# Patient Record
Sex: Male | Born: 1980 | Race: Black or African American | Hispanic: No
Health system: Southern US, Community
[De-identification: ages and names within clinical notes are randomized; demographics above are authoritative.]

---

## 2017-06-19 ENCOUNTER — Inpatient Hospital Stay
Admit: 2017-06-19 | Discharge: 2017-06-19 | Disposition: A | Discharge status: Home / Self Care | Payer: Medicaid - Out of State | Attending: Emergency Medicine

## 2017-06-19 DIAGNOSIS — K0889 Other specified disorders of teeth and supporting structures: Secondary | ICD-10-CM

## 2017-06-19 MED ORDER — TRAMADOL 50 MG TAB
50 mg | ORAL | Status: AC
Start: 2017-06-19 — End: 2017-06-19
  Administered 2017-06-19: 06:00:00 via ORAL

## 2017-06-19 MED ORDER — TRAMADOL 50 MG TAB
50 mg | ORAL_TABLET | Freq: Four times a day (QID) | ORAL | 0 refills | Status: DC | PRN
Start: 2017-06-19 — End: 2017-08-20

## 2017-06-19 MED FILL — TRAMADOL 50 MG TAB: 50 mg | ORAL | Qty: 2

## 2017-06-19 NOTE — ED Provider Notes (Addendum)
HPI Comments: 1:53 AM  Chief complaint: dental pain   History obtained from patient  Nicolas Walsh is a 36 y.o. male who presents to the Emergency Dept with c/o dental pain. He reports pain to his left lower jaw for the past few days. He has not yet seen a dentist. He tried taking tylenol and motrin for the pain which did not provide much relief. He denies any fever, chills or facial swelling.        History reviewed. No pertinent past medical history.    History reviewed. No pertinent surgical history.      History reviewed. No pertinent family history.    Social History     Social History   ??? Marital status: SINGLE     Spouse name: N/A   ??? Number of children: N/A   ??? Years of education: N/A     Occupational History   ??? Not on file.     Social History Main Topics   ??? Smoking status: Former Smoker   ??? Smokeless tobacco: Never Used   ??? Alcohol use No   ??? Drug use: No   ??? Sexual activity: Yes     Partners: Female     Other Topics Concern   ??? Not on file     Social History Narrative   ??? No narrative on file         ALLERGIES: Review of patient's allergies indicates no known allergies.    Review of Systems   Constitutional: Negative for chills and fever.   HENT: Positive for dental problem. Negative for sore throat.    Respiratory: Negative for cough and shortness of breath.    Cardiovascular: Negative for chest pain.   Gastrointestinal: Negative for abdominal pain, diarrhea, nausea and vomiting.   Genitourinary: Negative for flank pain.   Musculoskeletal: Negative for back pain, neck pain and neck stiffness.   Skin: Negative for rash.   Allergic/Immunologic: Negative for immunocompromised state.   Neurological: Negative for dizziness and headaches.       Vitals:    06/19/17 0148 06/19/17 0254   BP: (!) 164/94 121/76   Pulse: 70 72   Resp: 20 19   Temp: 97.7 ??F (36.5 ??C) 97.1 ??F (36.2 ??C)   SpO2: 100% 100%   Weight: 81.6 kg (180 lb)    Height:  (1.778 m)             Physical Exam    Constitutional: He is oriented to person, place, and time. Vital signs are normal. He appears well-developed and well-nourished. No distress.   HENT:   Head: Normocephalic and atraumatic.   No facial edema or erythema.  + tenderness to palpation to left lower jaw.  Mild tenderness to palpation over adjacent gingiva.  No gingival fluctuance or induration.    Eyes: Conjunctivae and EOM are normal. No scleral icterus.   Neck: Normal range of motion. Neck supple. No JVD present.   Pulmonary/Chest: Effort normal. No respiratory distress.   Neurological: He is alert and oriented to person, place, and time.   Skin: Skin is warm and dry. He is not diaphoretic.   Psychiatric: He has a normal mood and affect.   Nursing note and vitals reviewed.       MDM      ED Course       Procedures      <EMERGENCY DEPARTMENT CASE SUMMARY>    Impression/Differential Diagnosis: dentalgia    Plan: ultram  ED Course: Pt is a well appearing 37 y.o. male who presents to the ED with c/o dental pain. Rx ultram was provided. There is no facial swelling or fever to suggest a dental abscess at this time.     D/w patient dx tx plan, and need for follow-up. Pt advised to RTER for new or worsening symptoms. The patient verbalized understanding. All questions answered, and the patient agrees with the plan.    Final Impression/Diagnosis:   1. Dentalgia        Patient condition at time of disposition:  stable     I have reviewed the following home medications:    Prior to Admission medications    Medication Sig Start Date End Date Taking? Authorizing Provider   amLODIPine (NORVASC) 10 mg tablet Take 10 mg by mouth daily.   Yes Phys Other, MD   traMADol (ULTRAM) 50 mg tablet Take 1 Tab by mouth every six (6) hours as needed for Pain. Max Daily Amount: 200 mg. 06/19/17  Yes Peggye Form, NP       Peggye Form, NP     I was personally available for consultation in the emergency department.   I have reviewed the chart and agree with the documentation recorded by the [facility], including the assessment, treatment plan, and disposition.  Earnie Larsson, MD

## 2017-06-19 NOTE — ED Notes (Signed)
Pt discharged with instructions, pt verbalized understanding of instruction.  No visible distress noted, ambulates with steady gait without assistance.

## 2017-06-19 NOTE — ED Triage Notes (Signed)
Patient complains of toothache for a while. Had an appointment with dentist and did not went because of an accident. Took Ibuprofen before coming.

## 2017-08-20 DIAGNOSIS — K0889 Other specified disorders of teeth and supporting structures: Secondary | ICD-10-CM

## 2017-08-20 NOTE — ED Provider Notes (Addendum)
10:33 PM  Chief complaint: dental pain  History obtained from patient  Nicolas LarsenJeremy Walsh is a 36 y.o. male who presents to the Emergency Dept with c/o dental pain. He reports that a filling fell out of a tooth on the left lower jaw. [facility] for tomorrow morning. He is concerned that he needs an antibiotic.              History reviewed. No pertinent past medical history.    History reviewed. No pertinent surgical history.      History reviewed. No pertinent family history.    Social History     Socioeconomic History   ??? Marital status: SINGLE     Spouse name: Not on file   ??? Number of children: Not on file   ??? Years of education: Not on file   ??? Highest education level: Not on file   Social Needs   ??? Financial resource strain: Not on file   ??? Food insecurity - worry: Not on file   ??? Food insecurity - inability: Not on file   ??? Transportation needs - medical: Not on file   ??? Transportation needs - non-medical: Not on file   Occupational History   ??? Not on file   Tobacco Use   ??? Smoking status: Former Smoker   ??? Smokeless tobacco: Never Used   Substance and Sexual Activity   ??? Alcohol use: No   ??? Drug use: No   ??? Sexual activity: Yes     Partners: Female   Other Topics Concern   ??? Not on file   Social History Narrative   ??? Not on file         ALLERGIES: Patient has no known allergies.    Review of Systems   Constitutional: Negative for chills, diaphoresis and fever.   HENT: Positive for dental problem. Negative for rhinorrhea.    Eyes: Negative for photophobia and visual disturbance.   Respiratory: Negative for cough and shortness of breath.    Cardiovascular: Negative for chest pain.   Gastrointestinal: Negative for abdominal pain, diarrhea, nausea and vomiting.   Genitourinary: Negative for dysuria and hematuria.   Musculoskeletal: Negative for myalgias.   Skin: Negative for color change and pallor.   Neurological: Negative for seizures and syncope.       Vitals:    08/20/17 2144   BP: 131/80    Pulse: 92   Resp: 18   Temp: 98.6 ??F (37 ??C)   SpO2: 100%   Weight: 86.2 kg (190 lb)   Height: 5\' 10"  (1.778 m)            Physical Exam   Constitutional: He is oriented to person, place, and time. Vital signs are normal. He appears well-developed and well-nourished. No distress.   HENT:   Head: Normocephalic and atraumatic.   No facial edema or erythema.  + tenderness to palpation to lower gumline. No gingival fluctuance or induration.    Eyes: Conjunctivae and EOM are normal. No scleral icterus.   Neck: Normal range of motion. Neck supple. No JVD present.   Pulmonary/Chest: Effort normal. No respiratory distress.   Neurological: He is alert and oriented to person, place, and time.   Skin: Skin is warm and dry. He is not diaphoretic.   Psychiatric: He has a normal mood and affect.   Nursing note and vitals reviewed.       MDM       Procedures    <  EMERGENCY DEPARTMENT CASE SUMMARY>  Impression/Differential Diagnosis: dentalgia     Plan: motrin, clindamycin    ED Course: Pt is a well appearing 36 y.o. male who presents to the ED with c/o dental pain. Rx clindamycin was provided.    D/w patient dx, tx plan, and need for follow-up. Pt advised to RTER for new or worsening symptoms. The patient verbalized understanding. All questions answered, and the patient agrees with the plan.    Final Impression/Diagnosis:   1. Dentalgia        Patient condition at time of disposition:  stable   I have reviewed the following home medications:    Prior to Admission medications    Medication Sig Start Date End Date Taking? Authorizing Provider   clindamycin (CLEOCIN) 300 mg capsule Take 1 Cap by mouth three (3) times daily for 7 days. 08/20/17 08/27/17 Yes Mounitz, Orlean Pattenina E, NP   amLODIPine (NORVASC) 10 mg tablet Take 10 mg by mouth daily.    Other, Phys, MD       Peggye Formina E Mounitz, NP     I was personally available for consultation in the emergency department. I have reviewed the chart and agree with the documentation recorded by the  midlevel provider, including the assessment, treatment plan, and disposition.    Scot JunStephen Lynlee Stratton, MD

## 2017-08-20 NOTE — ED Notes (Signed)
Pt Discharged home. Discharge instructions, follow up instructions, and prescriptions discussed with patient. Pt ambulatory from ED. Gait steady. Pt DC instructions highlighted in accordance with policy for safety.

## 2017-08-20 NOTE — ED Triage Notes (Signed)
Patient is due to see a dentist for tooth extraction but could not take the pain any longer

## 2017-08-21 ENCOUNTER — Inpatient Hospital Stay
Admit: 2017-08-21 | Discharge: 2017-08-21 | Disposition: A | Discharge status: Home / Self Care | Payer: Medicaid - Out of State | Attending: Emergency Medicine

## 2017-08-21 MED ORDER — IBUPROFEN 600 MG TAB
600 mg | ORAL_TABLET | Freq: Four times a day (QID) | ORAL | 0 refills | Status: AC | PRN
Start: 2017-08-21 — End: ?

## 2017-08-21 MED ORDER — IBUPROFEN 600 MG TAB
600 mg | ORAL | Status: AC
Start: 2017-08-21 — End: 2017-08-20
  Administered 2017-08-21: 03:00:00 via ORAL

## 2017-08-21 MED ORDER — CLINDAMYCIN 150 MG CAP
150 mg | ORAL | Status: AC
Start: 2017-08-21 — End: 2017-08-20
  Administered 2017-08-21: 03:00:00 via ORAL

## 2017-08-21 MED ORDER — CLINDAMYCIN 300 MG CAP
300 mg | ORAL_CAPSULE | Freq: Three times a day (TID) | ORAL | 0 refills | Status: AC
Start: 2017-08-21 — End: 2017-08-27

## 2017-08-21 MED ORDER — AMOXICILLIN 875 MG TAB
875 mg | ORAL_TABLET | Freq: Two times a day (BID) | ORAL | 0 refills | Status: AC
Start: 2017-08-21 — End: 2017-08-31

## 2017-08-21 MED ORDER — CLINDAMYCIN 300 MG CAP
300 mg | ORAL_CAPSULE | Freq: Three times a day (TID) | ORAL | 0 refills | Status: DC
Start: 2017-08-21 — End: 2017-08-20

## 2017-08-21 MED ORDER — IBUPROFEN 600 MG TAB
600 mg | ORAL | Status: DC
Start: 2017-08-21 — End: 2017-08-20

## 2017-08-21 MED FILL — IBUPROFEN 600 MG TAB: 600 mg | ORAL | Qty: 1

## 2017-08-21 MED FILL — CLINDAMYCIN 150 MG CAP: 150 mg | ORAL | Qty: 2

## 2021-06-01 ENCOUNTER — Other Ambulatory Visit: Payer: Self-pay

## 2021-06-01 ENCOUNTER — Encounter (HOSPITAL_COMMUNITY): Payer: Self-pay | Admitting: Emergency Medicine

## 2021-06-01 ENCOUNTER — Emergency Department (HOSPITAL_COMMUNITY): Payer: Medicaid - Out of State

## 2021-06-01 ENCOUNTER — Emergency Department (HOSPITAL_COMMUNITY)
Admission: EM | Admit: 2021-06-01 | Discharge: 2021-06-02 | Disposition: A | Discharge status: Home / Self Care | Payer: Medicaid - Out of State | Attending: Emergency Medicine | Admitting: Emergency Medicine

## 2021-06-01 DIAGNOSIS — R591 Generalized enlarged lymph nodes: Secondary | ICD-10-CM | POA: Insufficient documentation

## 2021-06-01 DIAGNOSIS — R61 Generalized hyperhidrosis: Secondary | ICD-10-CM | POA: Diagnosis not present

## 2021-06-01 DIAGNOSIS — R634 Abnormal weight loss: Secondary | ICD-10-CM | POA: Insufficient documentation

## 2021-06-01 DIAGNOSIS — Z20822 Contact with and (suspected) exposure to covid-19: Secondary | ICD-10-CM | POA: Insufficient documentation

## 2021-06-01 DIAGNOSIS — R079 Chest pain, unspecified: Secondary | ICD-10-CM | POA: Insufficient documentation

## 2021-06-01 DIAGNOSIS — R509 Fever, unspecified: Secondary | ICD-10-CM | POA: Diagnosis not present

## 2021-06-01 DIAGNOSIS — R059 Cough, unspecified: Secondary | ICD-10-CM | POA: Diagnosis not present

## 2021-06-01 DIAGNOSIS — R0602 Shortness of breath: Secondary | ICD-10-CM | POA: Diagnosis not present

## 2021-06-01 LAB — CBC
HCT: 38 % — ABNORMAL LOW (ref 39.0–52.0)
Hemoglobin: 12.9 g/dL — ABNORMAL LOW (ref 13.0–17.0)
MCH: 30.7 pg (ref 26.0–34.0)
MCHC: 33.9 g/dL (ref 30.0–36.0)
MCV: 90.5 fL (ref 80.0–100.0)
Platelets: 279 10*3/uL (ref 150–400)
RBC: 4.2 MIL/uL — ABNORMAL LOW (ref 4.22–5.81)
RDW: 11.9 % (ref 11.5–15.5)
WBC: 9.1 10*3/uL (ref 4.0–10.5)
nRBC: 0 % (ref 0.0–0.2)

## 2021-06-01 LAB — BASIC METABOLIC PANEL
Anion gap: 6 (ref 5–15)
BUN: 14 mg/dL (ref 6–20)
CO2: 26 mmol/L (ref 22–32)
Calcium: 9.3 mg/dL (ref 8.9–10.3)
Chloride: 104 mmol/L (ref 98–111)
Creatinine, Ser: 0.88 mg/dL (ref 0.61–1.24)
GFR, Estimated: >60 mL/min (ref >60)
Glucose, Bld: 133 mg/dL — ABNORMAL HIGH (ref 70–99)
Potassium: 3.8 mmol/L (ref 3.5–5.1)
Sodium: 136 mmol/L (ref 135–145)

## 2021-06-01 LAB — TROPONIN I (HIGH SENSITIVITY)
Troponin I (High Sensitivity): 4 ng/L (ref <18)
Troponin I (High Sensitivity): 3 ng/L (ref <18)

## 2021-06-01 NOTE — ED Triage Notes (Signed)
Patient with chest pain and shortness of breath, had a long drive from [place] for funeral here in [place].  He states that he had been feeling poorly before the drive.  Patient states that he has pain when he breaths deep in his chest.  He does have some lymph node swelling in his neck.

## 2021-06-02 ENCOUNTER — Emergency Department (HOSPITAL_COMMUNITY): Payer: Medicaid - Out of State

## 2021-06-02 LAB — RESP PANEL BY RT-PCR (FLU A&B, COVID) ARPGX2
Influenza A by PCR: NEGATIVE
Influenza B by PCR: NEGATIVE
SARS Coronavirus 2 by RT PCR: NEGATIVE

## 2021-06-02 MED ORDER — IOHEXOL 350 MG/ML SOLN
50.0000 mL | Freq: Once | INTRAVENOUS | Status: AC | PRN
  Administered 2021-06-02: 50 mL via INTRAVENOUS

## 2021-06-02 NOTE — ED Provider Notes (Signed)
[facility] EMERGENCY DEPARTMENT Provider Note   CSN: [id number] Arrival date & time: 06/01/21  1919     History Chief Complaint  Patient presents with   Chest Pain   Shortness of Breath    Adrian Cunningham is a 40 y.o. male presents to the ER with worsening CP for the last 2 days.  Pt reports he has been feeling run down and generally unwell for the last several weeks, but has consistently tested negative for COVID-19.  Patient reports about 8 weeks ago he noticed some swelling above his left clavicle.  States that he saw his primary care doctor and they tested for HIV and other things but did not find a diagnosis.  Additionally he reports taking antibiotics for this but it did not improve.  He reports over the last month he has developed drenching night sweats, fevers, body aches, cough and weight loss.  Believes he is lost 7 pounds in the last several weeks.  States that the pain in his chest feels like a significant pressure and laying flat makes it worse.  Patient reports he had COVID 5 months ago but felt he was over those symptoms.  Denies a history of cancer.  No alleviating factors.  The history is provided by the patient and medical records. No language interpreter was used.      History reviewed. No pertinent past medical history.  There are no problems to display for this patient.   History reviewed. No pertinent surgical history.     No family history on file.     Home Medications Prior to Admission medications   Not on File    Allergies    Codeine and Penicillins  Review of Systems   Review of Systems  Constitutional:  Positive for fatigue. Negative for appetite change, diaphoresis, fever and unexpected weight change.  HENT:  Positive for congestion. Negative for mouth sores.   Eyes:  Negative for visual disturbance.  Respiratory:  Positive for chest tightness and shortness of breath. Negative for cough and wheezing.   Cardiovascular:   Positive for chest pain.  Gastrointestinal:  Negative for abdominal pain, constipation, diarrhea, nausea and vomiting.  Endocrine: Negative for polydipsia, polyphagia and polyuria.  Genitourinary:  Negative for dysuria, frequency, hematuria and urgency.  Musculoskeletal:  Negative for back pain and neck stiffness.  Skin:  Negative for rash.  Allergic/Immunologic: Negative for immunocompromised state.  Neurological:  Negative for syncope, light-headedness and headaches.  Hematological:  Does not bruise/bleed easily.  Psychiatric/Behavioral:  Negative for sleep disturbance. The patient is not nervous/anxious.    Physical Exam Updated Vital Signs BP (!) 145/75   Pulse 99   Temp 98.6 F (37 C)   Resp 17   SpO2 98%   Physical Exam Vitals and nursing note reviewed.  Constitutional:      General: He is not in acute distress.    Appearance: He is not diaphoretic.  HENT:     Head: Normocephalic.  Eyes:     General: No scleral icterus.    Conjunctiva/sclera: Conjunctivae normal.  Cardiovascular:     Rate and Rhythm: Normal rate and regular rhythm.     Pulses: Normal pulses.          Radial pulses are 2+ on the right side and 2+ on the left side.     Heart sounds: Normal heart sounds.  Pulmonary:     Effort: Pulmonary effort is normal. No tachypnea, accessory muscle usage, prolonged expiration, respiratory distress  or retractions.     Breath sounds: Normal breath sounds. No stridor. No wheezing or rhonchi.     Comments: Equal chest rise. No increased work of breathing. Chest:    Abdominal:     General: There is no distension.     Palpations: Abdomen is soft.     Tenderness: There is no abdominal tenderness. There is no guarding or rebound.  Musculoskeletal:     Cervical back: Normal range of motion.     Comments: Moves all extremities equally and without difficulty.  Lymphadenopathy:     Upper Body:     Right upper body: No supraclavicular adenopathy.     Left upper body:  Supraclavicular adenopathy present.  Skin:    General: Skin is warm and dry.     Capillary Refill: Capillary refill takes less than 2 seconds.  Neurological:     Mental Status: He is alert.     GCS: GCS eye subscore is 4. GCS verbal subscore is 5. GCS motor subscore is 6.     Comments: Speech is clear and goal oriented.  Psychiatric:        Mood and Affect: Mood normal.    ED Results / Procedures / Treatments   Labs (all labs ordered are listed, but only abnormal results are displayed) Labs Reviewed  BASIC METABOLIC PANEL - Abnormal; Notable for the following components:      Result Value   Glucose, Bld 133 (*)    All other components within normal limits  CBC - Abnormal; Notable for the following components:   RBC 4.20 (*)    Hemoglobin 12.9 (*)    HCT 38.0 (*)    All other components within normal limits  RESP PANEL BY RT-PCR (FLU A&B, COVID) [id number]  TROPONIN I (HIGH SENSITIVITY)  TROPONIN I (HIGH SENSITIVITY)    EKG EKG Interpretation  Date/Time:  Saturday June 01 2021 20:00:32 EDT Ventricular Rate:  89 PR Interval:  144 QRS Duration: 84 QT Interval:  336 QTC Calculation: 408 R Axis:   64 Text Interpretation: Normal sinus rhythm Minimal voltage criteria for LVH, may be normal variant ( Sokolow-Lyon ) Septal infarct , age undetermined Abnormal ECG no prior available for comparison Confirmed by Tilden Fossa [phone]) on 06/02/2021 12:09:29 AM  Radiology DG Chest 2 View  Result Date: 06/01/2021 CLINICAL DATA:  Chest pain. Shortness of breath. had a long drive from [place] for funeral here in [place]. He states that he had been feeling poorly before the drive EXAM: CHEST - 2 VIEW COMPARISON:  None. FINDINGS: The heart and mediastinal contours are within normal limits. No focal consolidation. No pulmonary edema. No pleural effusion. No pneumothorax. No acute osseous abnormality. IMPRESSION: No active cardiopulmonary disease. Electronically Signed   By: Tish Frederickson M.D.   On:  06/01/2021 20:54   CT Angio Chest PE W and/or Wo Contrast  Result Date: 06/02/2021 CLINICAL DATA:  PE suspected, high prob Chest pain and shortness of breath.  Recent long car ride. EXAM: CT ANGIOGRAPHY CHEST WITH CONTRAST TECHNIQUE: Multidetector CT imaging of the chest was performed using the standard protocol during bolus administration of intravenous contrast. Multiplanar CT image reconstructions and MIPs were obtained to evaluate the vascular anatomy. CONTRAST:  10mL OMNIPAQUE IOHEXOL 350 MG/ML SOLN COMPARISON:  Radiograph yesterday. FINDINGS: Cardiovascular: There are no filling defects within the pulmonary arteries to suggest pulmonary embolus. There is some streak artifact from dense IV contrast in the SVC obscuring right upper lobe evaluation. Thoracic aorta is normal  in caliber. Aberrant right subclavian artery courses posterior to the esophagus. No aortic dissection. The heart is normal in size. No pericardial effusion. Mediastinum/Nodes: Soft tissue densities throughout the mediastinum and both hilar regions suspicious for adenopathy. Discrete lymph nodes are difficult to measure at multiple levels, however there is a 2 cm subcarinal node, series 5, image 70. Right hilar nodes measure 12-13 mm, series 5, image 67, 69, and 73 there is a 10 mm prevascular node. 15 mm high mediastinal node series 5, image 40. No axillary adenopathy. The esophagus difficult to delineate, however no definite esophageal wall thickening. No thyroid nodule. Lungs/Pleura: Apical emphysema or blebs. No pneumonia or focal airspace disease. No pulmonary nodule or mass. No pleural fluid. No findings of pulmonary edema. Upper Abdomen: No acute or unexpected findings. No evidence of upper abdominal adenopathy. Musculoskeletal: There are no acute or suspicious osseous abnormalities. No chest wall soft tissue abnormality. Review of the MIP images confirms the above findings. IMPRESSION: 1. No pulmonary embolus. 2. Mediastinal and  bilateral hilar adenopathy, with largest lymph node measuring 2 cm in the subcarinal station. Recommend clinical and laboratory evaluation for lymphoproliferative disorder. Follow-up CT or PET/CT in 3-6 months. 3. Apical emphysema or blebs. No acute pulmonary process or pulmonary nodule. 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 Electronically Signed   By: Narda Rutherford M.D.   On: 06/02/2021 03:00    Procedures Procedures   Medications Ordered in ED Medications  iohexol (OMNIPAQUE) 350 MG/ML injection 50 mL (50 mLs Intravenous Contrast Given 06/02/21 0246)    ED Course  I have reviewed the triage vital signs and the nursing notes.  Pertinent labs & imaging results that were available during my care of the patient were reviewed by me and considered in my medical decision making (see chart for details).    MDM Rules/Calculators/A&P                            Patient presents with concerning symptoms for lymphoproliferative disorder.  Supraclavicular lymph node is nontender, nonmobile and matted.  He now has chest pain, cough, night sweats, fevers and weight loss.  He has not had any known imaging.  Tachycardic on arrival.  Some concern for pulmonary embolism as well since he has traveled from [place] in the last several days for a family funeral.  4:06 AM CT scan with mediastinal lymphadenopathy and bilateral hilar adenopathy.  Concern for lymphoproliferative disorder - suspect lymphoma.  No evidence of pulmonary embolism.  Long discussion with patient about concern for this and need for close follow-up.  He has been given a copy of his labs to take to his primary care.  Questions answered.  Discussed reasons to return immediately to the emergency department.  Patient states understanding and is in agreement with the plan.    Final Clinical Impression(s) / ED Diagnoses Final diagnoses:  Chest pain, unspecified type  Lymphadenopathy    Rx / DC Orders ED Discharge  Orders     None        Dick Hark, Boyd Kerbs 06/02/21 0408    Tilden Fossa, MD 06/02/21 [phone]

## 2021-06-02 NOTE — Discharge Instructions (Addendum)
Your CT scan is concerning for lymphoma - cancer of the lymph nodes.  You will need additional testing for a final diagnosis.  Please utilize the findings from today's CT scan to discuss with your primary care physician additional steps for diagnosis and treatment.  1. Medications: usual home medications 2. Treatment: rest, drink plenty of fluids,  3. Follow Up: Please followup with your primary doctor in 2-3 days for discussion of your diagnoses and further evaluation after today's visit; if you do not have a primary care doctor use the resource guide provided to find one; Please return to the ER for new or worsening symptoms

## 2021-06-02 NOTE — ED Notes (Signed)
Pt discharged and ambulated out of the ED without difficulty. 

## 2021-12-05 ENCOUNTER — Encounter (HOSPITAL_COMMUNITY): Payer: Self-pay | Admitting: Radiology

## 2022-10-22 IMAGING — CT CT ANGIO CHEST
2 of 6 series · 18 of 36 positions shown · IV contrast (omnipaque)
Comparison: Radiograph yesterday.

CLINICAL DATA: PE suspected, high prob

Chest pain and shortness of breath.  Recent long car ride.
EXAM:
CT ANGIOGRAPHY CHEST WITH CONTRAST
TECHNIQUE: Multidetector CT imaging of the chest was performed using the
standard protocol during bolus administration of intravenous
contrast. Multiplanar CT image reconstructions and MIPs were
obtained to evaluate the vascular anatomy.
CONTRAST:  50mL OMNIPAQUE IOHEXOL 350 MG/ML SOLN

[Series 7: pe thins · axial · 0.74mm/px · z∈[-283,-16]mm · 17 of 426 slices shown]
[im 22/426  lung]
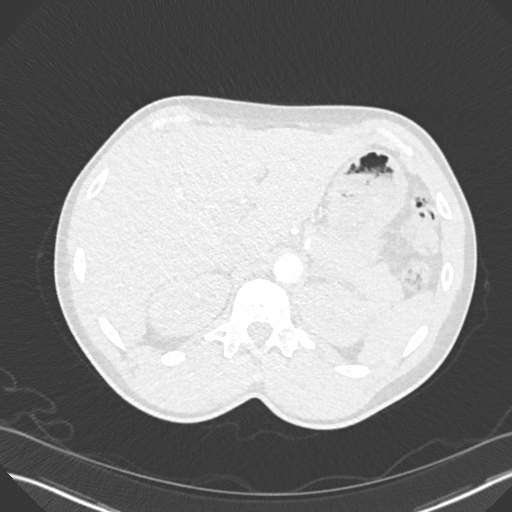
[im 43/426  mediastinal]
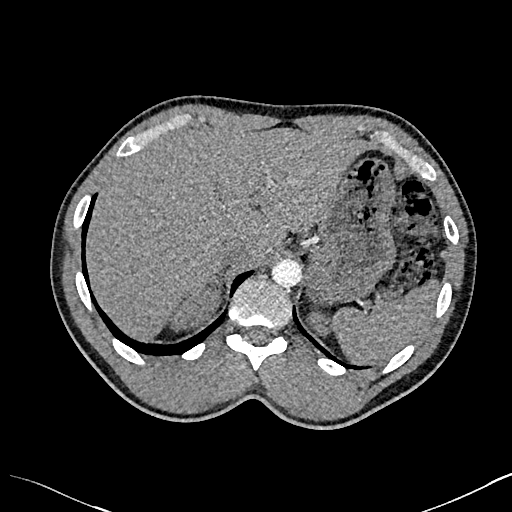
[im 64/426  lung]
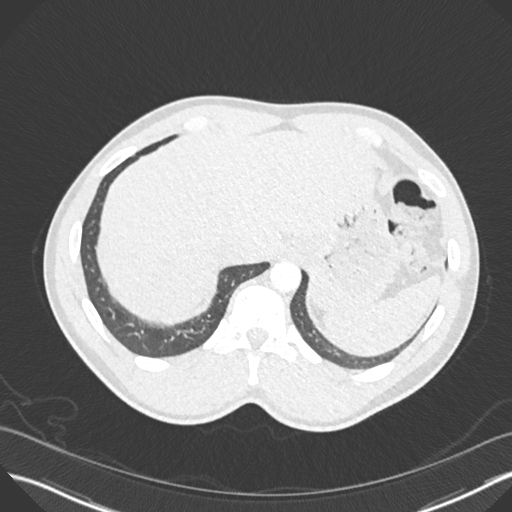
[im 86/426  mediastinal]
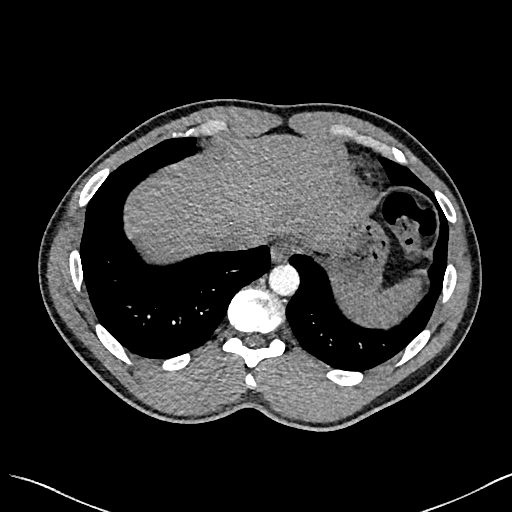
[im 128/426  lung]
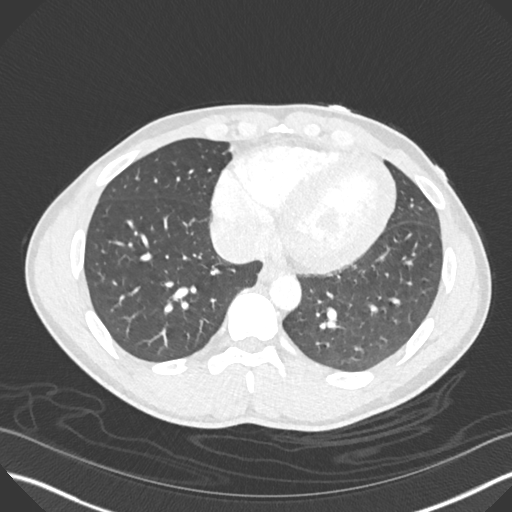
[im 149/426  mediastinal]
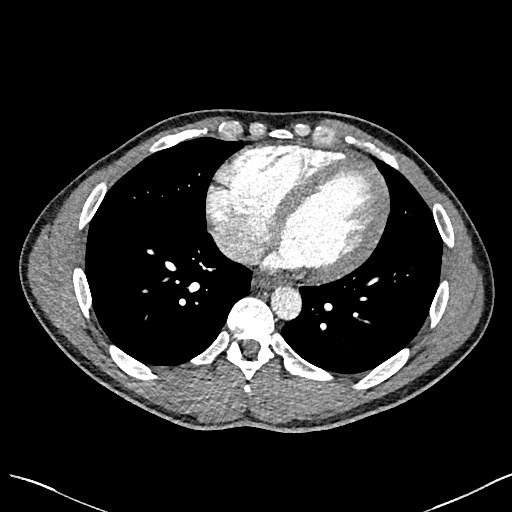
[im 171/426  lung]
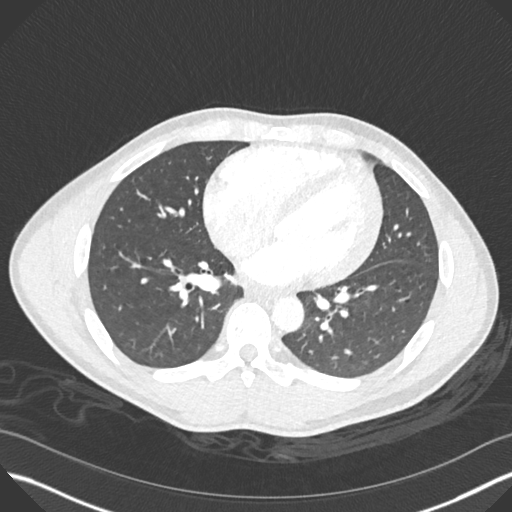
[im 192/426  mediastinal]
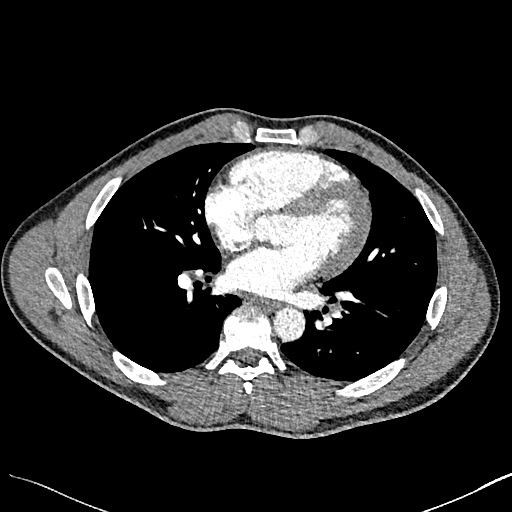
[im 213/426  lung]
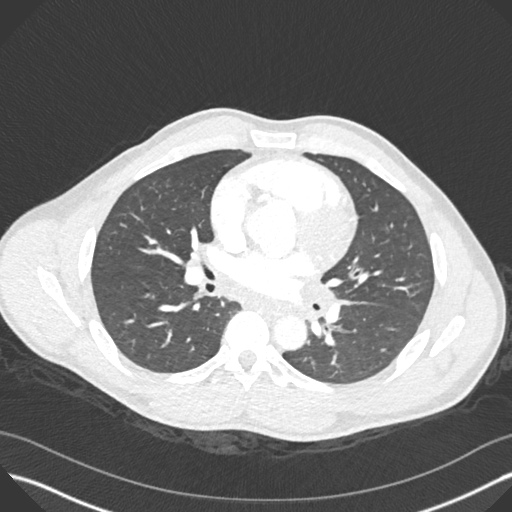
[im 234/426  mediastinal]
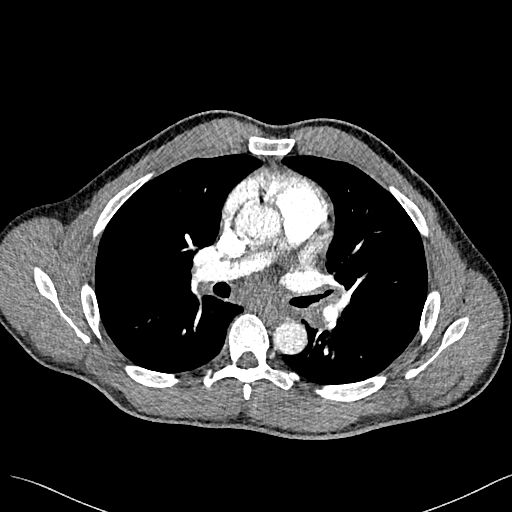
[im 256/426  lung]
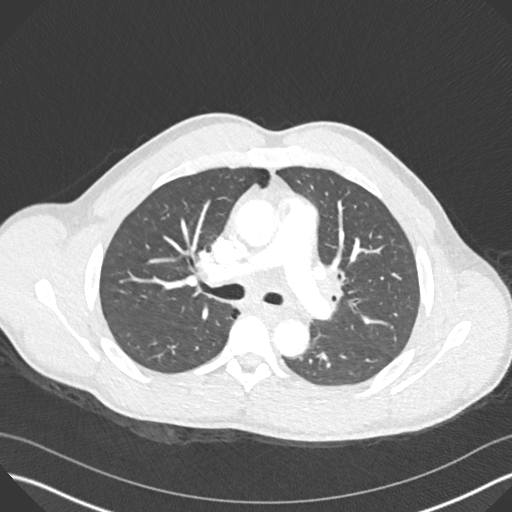
[im 277/426  mediastinal]
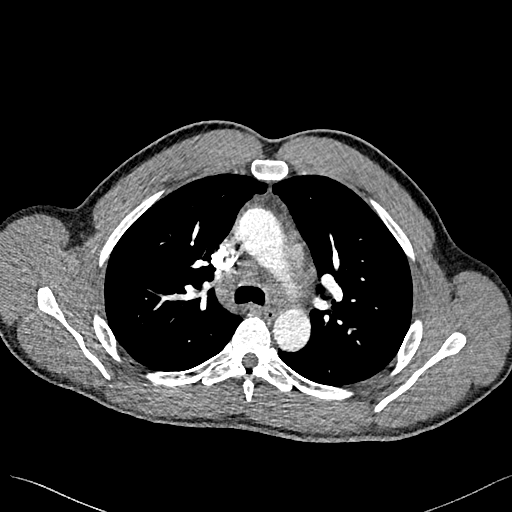
[im 298/426  lung]
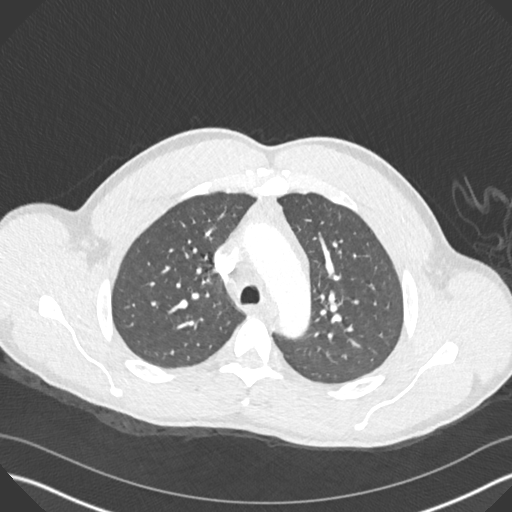
[im 341/426  mediastinal]
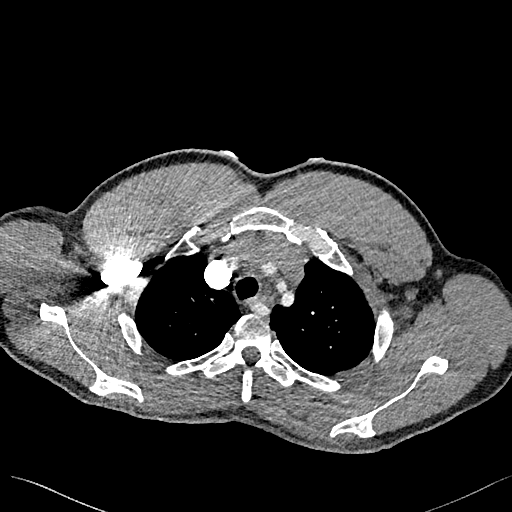
[im 362/426  lung]
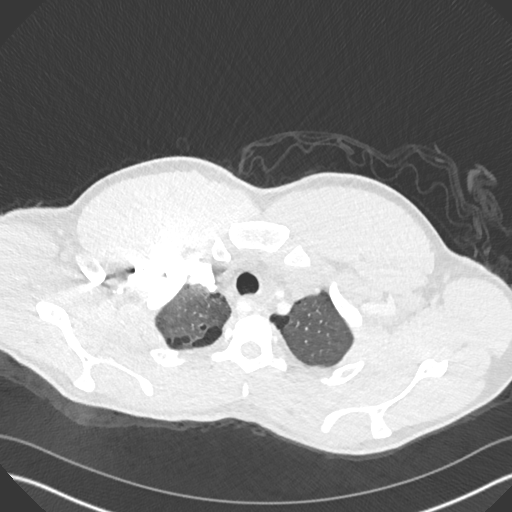
[im 383/426  mediastinal]
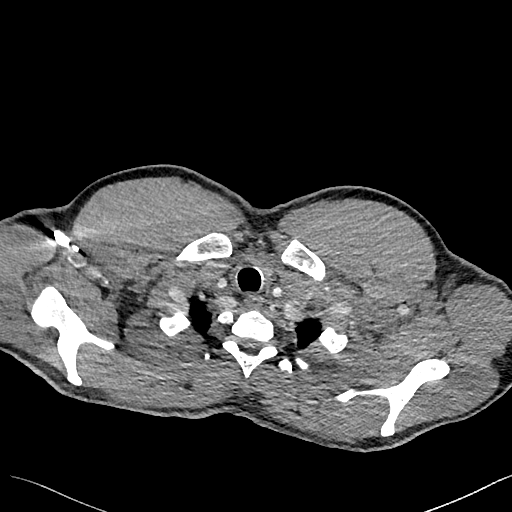
[im 404/426  lung]
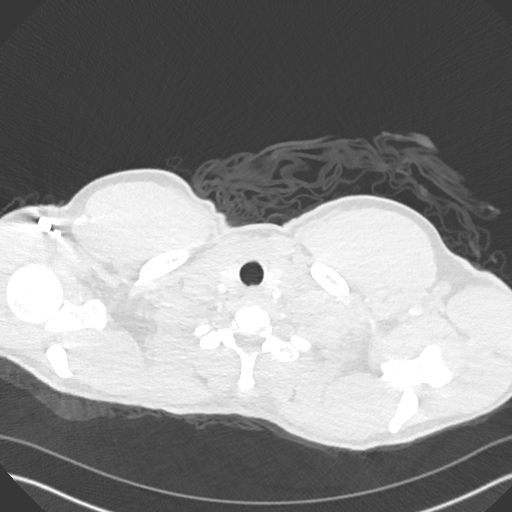

[Series 8: pe 2mm cor · coronal · 0.59mm/px · 1 of 146 slices shown]
[im 73/146  mediastinal]
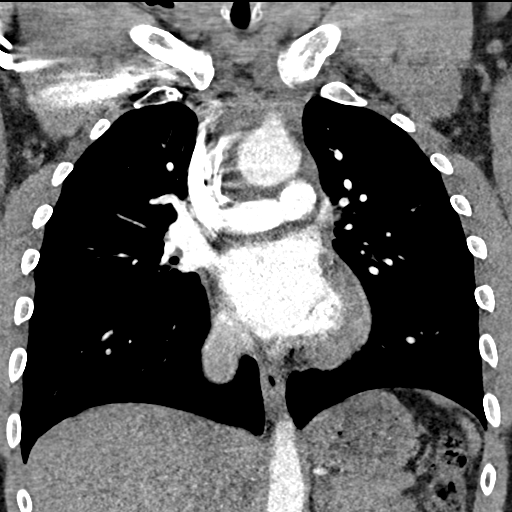

[18 of 36 positions shown; findings below may reference images not displayed]

FINDINGS: Cardiovascular: There are no filling defects within the pulmonary
arteries to suggest pulmonary embolus. There is some streak artifact
from dense IV contrast in the SVC obscuring right upper lobe
evaluation. Thoracic aorta is normal in caliber. Aberrant right
subclavian artery courses posterior to the esophagus. No aortic
dissection. The heart is normal in size. No pericardial effusion.

Mediastinum/Nodes: Soft tissue densities throughout the mediastinum
and both hilar regions suspicious for adenopathy. Discrete lymph
nodes are difficult to measure at multiple levels, however there is
a 2 cm subcarinal node, series 5, image 70. Right hilar nodes
measure 12-13 mm, series 5, image 67, 69, and 73 there is a 10 mm
prevascular node. 15 mm high mediastinal node series 5, image 40. No
axillary adenopathy. The esophagus difficult to delineate, however
no definite esophageal wall thickening. No thyroid nodule.

Lungs/Pleura: Apical emphysema or blebs. No pneumonia or focal
airspace disease. No pulmonary nodule or mass. No pleural fluid. No
findings of pulmonary edema.

Upper Abdomen: No acute or unexpected findings. No evidence of upper
abdominal adenopathy.

Musculoskeletal: There are no acute or suspicious osseous
abnormalities. No chest wall soft tissue abnormality.

Review of the MIP images confirms the above findings.
IMPRESSION: 1. No pulmonary embolus.
2. Mediastinal and bilateral hilar adenopathy, with largest lymph
node measuring 2 cm in the subcarinal station. Recommend clinical
and laboratory evaluation for lymphoproliferative disorder.
Follow-up CT or PET/CT in 3-6 months.
3. Apical emphysema or blebs. No acute pulmonary process or
pulmonary nodule.
4. Aberrant right subclavian artery.

Emphysema ([id number]).
